# Patient Record
Sex: Male | Born: 1960 | Race: Black or African American | Hispanic: No | Marital: Single | Smoking: Current every day smoker
Health system: Southern US, Community
[De-identification: ages and names within clinical notes are randomized; demographics above are authoritative.]

## PROBLEM LIST (undated history)

## (undated) HISTORY — PX: CARDIAC SURGERY: SHX584

---

## 2018-09-15 ENCOUNTER — Emergency Department (HOSPITAL_COMMUNITY)
Admission: EM | Admit: 2018-09-15 | Discharge: 2018-09-15 | Disposition: A | Discharge status: Home / Self Care | Payer: Medicaid Other | Attending: Emergency Medicine | Admitting: Emergency Medicine

## 2018-09-15 ENCOUNTER — Other Ambulatory Visit: Payer: Self-pay

## 2018-09-15 ENCOUNTER — Encounter (HOSPITAL_COMMUNITY): Payer: Self-pay | Admitting: Emergency Medicine

## 2018-09-15 DIAGNOSIS — F17210 Nicotine dependence, cigarettes, uncomplicated: Secondary | ICD-10-CM | POA: Insufficient documentation

## 2018-09-15 DIAGNOSIS — H109 Unspecified conjunctivitis: Secondary | ICD-10-CM

## 2018-09-15 DIAGNOSIS — H1031 Unspecified acute conjunctivitis, right eye: Secondary | ICD-10-CM | POA: Insufficient documentation

## 2018-09-15 DIAGNOSIS — H11001 Unspecified pterygium of right eye: Secondary | ICD-10-CM | POA: Diagnosis not present

## 2018-09-15 DIAGNOSIS — H579 Unspecified disorder of eye and adnexa: Secondary | ICD-10-CM | POA: Diagnosis present

## 2018-09-15 MED ORDER — FLUORESCEIN SODIUM 1 MG OP STRP
1.0000 | ORAL_STRIP | Freq: Once | OPHTHALMIC | Status: AC
  Administered 2018-09-15: 1 via OPHTHALMIC
  Filled 2018-09-15: qty 1

## 2018-09-15 MED ORDER — ERYTHROMYCIN 5 MG/GM OP OINT
TOPICAL_OINTMENT | Freq: Once | OPHTHALMIC | Status: AC
  Administered 2018-09-15: 1 via OPHTHALMIC
  Filled 2018-09-15: qty 3.5

## 2018-09-15 MED ORDER — TETRACAINE HCL 0.5 % OP SOLN
2.0000 [drp] | Freq: Once | OPHTHALMIC | Status: AC
  Administered 2018-09-15: 2 [drp] via OPHTHALMIC
  Filled 2018-09-15: qty 4

## 2018-09-15 NOTE — ED Provider Notes (Signed)
[facility]-EMERGENCY DEPT Provider Note   CSN: [id number] Arrival date & time: 09/15/18  1916     History   Chief Complaint Chief Complaint  Patient presents with  . Eye Problem    HPI Jeremy Bryant is a 58 y.o. male with a hx of tobacco abuse who presents to the ED with complaints of R eye irritation x 2-3 days. Patient reports R eye is red, itchy, irritated, and has crusty drainage especially in the AM. Applied topical abx ointment that was OTC with some relief. Some mild blurry vision at times in this eye when it is irritated/crusted. Denies fever, chills, congestion, eye pain, vomiting, or headache. No contact lens/glasses use.   HPI  History reviewed. No pertinent past medical history.  There are no active problems to display for this patient.   Past Surgical History:  Procedure Laterality Date  . CARDIAC SURGERY          Home Medications    Prior to Admission medications   Not on File    Family History History reviewed. No pertinent family history.  Social History Social History   Tobacco Use  . Smoking status: Current Every Day Smoker  . Smokeless tobacco: Never Used  Substance Use Topics  . Alcohol use: Not on file  . Drug use: Not on file     Allergies   Patient has no allergy information on record.   Review of Systems Review of Systems  Constitutional: Negative for chills and fever.  HENT: Negative for congestion and sore throat.   Eyes: Positive for discharge, redness, itching and visual disturbance.  Respiratory: Negative for cough and shortness of breath.   Cardiovascular: Negative for chest pain.     Physical Exam Updated Vital Signs BP (!) 144/83   Pulse 71   Temp 98.9 F (37.2 C) (Oral)   Resp 18   Ht 5' 11.5" (1.816 m)   Wt 97.5 kg   SpO2 96%   BMI 29.57 kg/m   Physical Exam Vitals signs and nursing note reviewed.  Constitutional:      General: He is not in acute distress.    Appearance: He is  well-developed.  HENT:     Head: Normocephalic and atraumatic.     Right Ear: Tympanic membrane normal.     Left Ear: Tympanic membrane normal.     Nose: Congestion present.     Mouth/Throat:     Mouth: Mucous membranes are moist.     Pharynx: No oropharyngeal exudate or posterior oropharyngeal erythema.  Eyes:     General: Lids are everted, no foreign bodies appreciated. Vision grossly intact. Gaze aligned appropriately.        Right eye: No foreign body or discharge.        Left eye: No foreign body or discharge.     Extraocular Movements: Extraocular movements intact.     Conjunctiva/sclera:     Right eye: Right conjunctiva is injected. No chemosis, exudate or hemorrhage.    Left eye: Left conjunctiva is not injected. No chemosis, exudate or hemorrhage.    Comments: No proptosis. No periorbital erythema/edema/tenderness. EOMI. Visual acuity 20/50 in each eye and bilaterally.  R eye with pterygium present to medial aspect of the eye.  R eye: Fluorescein stain without uptake. No evidence of abrasion/ulceration. No dendritic staining. R eye IOP 18.   Neck:     Musculoskeletal: Normal range of motion. No neck rigidity.  Neurological:     Mental Status: He  is alert.     Comments: Clear speech.   Psychiatric:        Behavior: Behavior normal.        Thought Content: Thought content normal.      ED Treatments / Results  Labs (all labs ordered are listed, but only abnormal results are displayed) Labs Reviewed - No data to display  EKG None  Radiology No results found.  Procedures Procedures (including critical care time)  Medications Ordered in ED Medications  fluorescein ophthalmic strip 1 strip (1 strip Right Eye Given 09/15/18 1951)  tetracaine (PONTOCAINE) 0.5 % ophthalmic solution 2 drop (2 drops Right Eye Given 09/15/18 1950)     Initial Impression / Assessment and Plan / ED Course  I have reviewed the triage vital signs and the nursing notes.  Pertinent labs &  imaging results that were available during my care of the patient were reviewed by me and considered in my medical decision making (see chart for details).   Patient presents to the ED w/ right eye irritation, erythema, and crusty drainage. PERRL. Patient is afebrile and without proptosis, entrapment, or consensual photophobia, no periorbital swelling- doubt periorbital or orbital cellulitis. There is no fluorescein uptake on exam, no indication of corneal abrasion/ulceration or HSV.  IOP WNL, doubt acute glaucoma. No significant visual acuity deficit- equal bilaterally. He does have a pterygium present. Possible conjunctivitis based on history & exam. Given crusty drainage in the AM will cover for bacterial w/ erythromycin ophthalmic ointment, patient is not a contact lens wearer. Ophthalmology follow up. I discussed results, treatment plan, need for follow-up, and return precautions with the patient. Provided opportunity for questions, patient confirmed understanding and is in agreement with plan.    Final Clinical Impressions(s) / ED Diagnoses   Final diagnoses:  Conjunctivitis of right eye, unspecified conjunctivitis type  Pterygium of right eye    ED Discharge Orders    None       Desmond Lope 09/15/18 2039    Raeford Razor, MD 09/16/18 1054

## 2018-09-15 NOTE — ED Triage Notes (Signed)
Three days ago the patient noticed itching puff red eye. He has applied ointment and lube with some relief but he still has redness in the right eye.

## 2018-09-15 NOTE — ED Notes (Signed)
Both eyes 20/50

## 2018-09-15 NOTE — Discharge Instructions (Signed)
You were seen in the ER for eye irritation. Please use the erythromycin ointment: 1/2 inch ribbon 4-6 times per day for the next 5-7 days. Follow up with ophthalmology or primary care within 3 days for re-evaluation. Return to the ER for new or worsening symptoms including but not limited to worsened pain, pain moving the eye, change in your vision, headache, swelling/redness around the eye, or any other concerns.

## 2018-10-06 ENCOUNTER — Emergency Department (HOSPITAL_COMMUNITY)
Admission: EM | Admit: 2018-10-06 | Discharge: 2018-10-06 | Disposition: A | Discharge status: Home / Self Care | Payer: Medicaid Other | Attending: Emergency Medicine | Admitting: Emergency Medicine

## 2018-10-06 ENCOUNTER — Other Ambulatory Visit: Payer: Self-pay

## 2018-10-06 ENCOUNTER — Emergency Department (HOSPITAL_COMMUNITY): Payer: Medicaid Other

## 2018-10-06 ENCOUNTER — Encounter (HOSPITAL_COMMUNITY): Payer: Self-pay

## 2018-10-06 DIAGNOSIS — F17210 Nicotine dependence, cigarettes, uncomplicated: Secondary | ICD-10-CM | POA: Diagnosis not present

## 2018-10-06 DIAGNOSIS — F1910 Other psychoactive substance abuse, uncomplicated: Secondary | ICD-10-CM | POA: Insufficient documentation

## 2018-10-06 DIAGNOSIS — R072 Precordial pain: Secondary | ICD-10-CM | POA: Diagnosis not present

## 2018-10-06 DIAGNOSIS — R079 Chest pain, unspecified: Secondary | ICD-10-CM | POA: Diagnosis present

## 2018-10-06 LAB — CBC WITH DIFFERENTIAL/PLATELET
Abs Immature Granulocytes: 0.03 10*3/uL (ref 0.00–0.07)
Basophils Absolute: 0.1 10*3/uL (ref 0.0–0.1)
Basophils Relative: 1 %
Eosinophils Absolute: 0.2 10*3/uL (ref 0.0–0.5)
Eosinophils Relative: 2 %
HEMATOCRIT: 45.5 % (ref 39.0–52.0)
HEMOGLOBIN: 14.9 g/dL (ref 13.0–17.0)
Immature Granulocytes: 0 %
LYMPHS PCT: 17 %
Lymphs Abs: 1.6 10*3/uL (ref 0.7–4.0)
MCH: 30.7 pg (ref 26.0–34.0)
MCHC: 32.7 g/dL (ref 30.0–36.0)
MCV: 93.8 fL (ref 80.0–100.0)
Monocytes Absolute: 0.8 10*3/uL (ref 0.1–1.0)
Monocytes Relative: 9 %
Neutro Abs: 6.6 10*3/uL (ref 1.7–7.7)
Neutrophils Relative %: 71 %
Platelets: 208 10*3/uL (ref 150–400)
RBC: 4.85 MIL/uL (ref 4.22–5.81)
RDW: 12.2 % (ref 11.5–15.5)
WBC: 9.4 10*3/uL (ref 4.0–10.5)
nRBC: 0 % (ref 0.0–0.2)

## 2018-10-06 LAB — BASIC METABOLIC PANEL
Anion gap: 15 (ref 5–15)
BUN: 15 mg/dL (ref 6–20)
CO2: 19 mmol/L — ABNORMAL LOW (ref 22–32)
Calcium: 8.7 mg/dL — ABNORMAL LOW (ref 8.9–10.3)
Chloride: 103 mmol/L (ref 98–111)
Creatinine, Ser: 1.22 mg/dL (ref 0.61–1.24)
GFR calc Af Amer: >60 mL/min (ref >60)
GFR calc non Af Amer: >60 mL/min (ref >60)
Glucose, Bld: 90 mg/dL (ref 70–99)
Potassium: 3.7 mmol/L (ref 3.5–5.1)
Sodium: 137 mmol/L (ref 135–145)

## 2018-10-06 LAB — ETHANOL: Alcohol, Ethyl (B): 44 mg/dL — ABNORMAL HIGH (ref <10)

## 2018-10-06 LAB — TROPONIN I
Troponin I: <0.03 ng/mL (ref <0.03)
Troponin I: <0.03 ng/mL (ref <0.03)

## 2018-10-06 NOTE — ED Notes (Signed)
Gave pt urinal and instructed him to give us a sample

## 2018-10-06 NOTE — ED Notes (Signed)
Patient transported to X-ray 

## 2018-10-06 NOTE — ED Triage Notes (Signed)
Pt arrives EMS from scene where he developed chest pain after snorting unknown opiate, possibly fentanyl, possible crack use. Walked up to PD 324 aspirin and nitro 0.4 PTA

## 2018-10-06 NOTE — ED Provider Notes (Signed)
Emergency Department Provider Note   I have reviewed the triage vital signs and the nursing notes.   HISTORY  Chief Complaint Chest Pain   HPI Jeremy Bryant is a 58 y.o. male with PMH of CAD s/p triple bypass and polysubstance abuse presents to the emergency department for evaluation after experiencing chest pain while using drugs.  The patient snorted an unknown substance which he presumed was some kind of opiate but is concerned it may have been laced with cocaine.  He did use cocaine yesterday.  He was using these drugs with another person who injected them but he denies any IV drug use.  No fevers.  He had sudden onset of chest pain shortly after snorting drugs and apparently walked up to a Emergency planning/management officer for help who called EMS.  He was given 324 mg aspirin and a single nitroglycerin.  His chest pain has since resolved.  EMS report the patient has become somewhat more somnolent in route.  Patient denies any other associated symptoms or pain.    History reviewed. No pertinent past medical history.  There are no active problems to display for this patient.   Past Surgical History:  Procedure Laterality Date  . CARDIAC SURGERY      Allergies Iodinated diagnostic agents and Nsaids  History reviewed. No pertinent family history.  Social History Social History   Tobacco Use  . Smoking status: Current Every Day Smoker  . Smokeless tobacco: Never Used  Substance Use Topics  . Alcohol use: Yes  . Drug use: Yes    Types: Cocaine    Comment: fentanyl    Review of Systems  Constitutional: No fever/chills Eyes: No visual changes. ENT: No sore throat. Cardiovascular: Positive chest pain. Respiratory: Denies shortness of breath. Gastrointestinal: No abdominal pain.  No nausea, no vomiting.  No diarrhea.  No constipation. Genitourinary: Negative for dysuria. Musculoskeletal: Negative for back pain. Skin: Negative for rash. Neurological: Negative for headaches, focal  weakness or numbness.  10-point ROS otherwise negative.  ____________________________________________   PHYSICAL EXAM:  VITAL SIGNS: ED Triage Vitals  Enc Vitals Group     BP 10/06/18 1755 137/66     Pulse Rate 10/06/18 1755 77     Resp 10/06/18 1755 20     Temp 10/06/18 1755 98.7 F (37.1 C)     Temp Source 10/06/18 1755 Oral     SpO2 10/06/18 1755 97 %     Weight 10/06/18 1757 215 lb (97.5 kg)     Height 10/06/18 1757 6' (1.829 m)     Pain Score 10/06/18 1756 0   Constitutional: Alert but slightly drowsiness. No acute distress.  Eyes: Conjunctivae are normal.  Head: Atraumatic. Nose: No congestion/rhinnorhea. Mouth/Throat: Mucous membranes are moist.  Neck: No stridor.  Cardiovascular: Normal rate, regular rhythm. Good peripheral circulation. Grossly normal heart sounds.   Respiratory: Normal respiratory effort.  No retractions. Lungs CTAB. Gastrointestinal: Soft and nontender. No distention.  Musculoskeletal: No lower extremity tenderness nor edema. No gross deformities of extremities. Neurologic:  Normal speech and language. No gross focal neurologic deficits are appreciated.  Skin:  Skin is warm, dry and intact. No rash noted.  ____________________________________________   LABS (all labs ordered are listed, but only abnormal results are displayed)  Labs Reviewed  BASIC METABOLIC PANEL - Abnormal; Notable for the following components:      Result Value   CO2 19 (*)    Calcium 8.7 (*)    All other components within normal limits  ETHANOL - Abnormal; Notable for the following components:   Alcohol, Ethyl (B) 44 (*)    All other components within normal limits  RAPID URINE DRUG SCREEN, HOSP PERFORMED - Abnormal; Notable for the following components:   Opiates POSITIVE (*)    Cocaine POSITIVE (*)    All other components within normal limits  TROPONIN I  CBC WITH DIFFERENTIAL/PLATELET  TROPONIN I   ____________________________________________  EKG   EKG  Interpretation  Date/Time:  Monday October 06 2018 17:54:39 EST Ventricular Rate:  75 PR Interval:    QRS Duration: 162 QT Interval:  439 QTC Calculation: 491 R Axis:   -44 Text Interpretation:  Sinus rhythm Atrial premature complexes Probable left atrial enlargement Right bundle branch block Anterolateral infarct, age indeterminate No STEMI. No prior tracing for comparison.  Confirmed by Alona Bene [phone]) on 10/06/2018 5:57:33 PM       ____________________________________________  RADIOLOGY  Dg Chest 2 View  Result Date: 10/06/2018 CLINICAL DATA:  Chest pain following drug use. EXAM: CHEST - 2 VIEW COMPARISON:  None. FINDINGS: Enlarged cardiac silhouette. Post CABG changes. Diffuse peribronchial thickening. Thoracolumbar spine degenerative changes. IMPRESSION: Cardiomegaly and chronic bronchitic changes. Electronically Signed   By: Beckie Salts M.D.   On: 10/06/2018 19:25    ____________________________________________   PROCEDURES  Procedure(s) performed:   Procedures  None ____________________________________________   INITIAL IMPRESSION / ASSESSMENT AND PLAN / ED COURSE  Pertinent labs & imaging results that were available during my care of the patient were reviewed by me and considered in my medical decision making (see chart for details).  Patient presents to the emergency department with chest pain after snorting an unknown substance.  He suspect that this was a mixture of some kind of opiate and possible cocaine.  Chest pain resolved in route with EMS.  He is slightly drowsy but breathing at a normal rate and with normal oxygen saturation.  No Narcan at this time.  Plan for screening labs.  EKG is abnormal and I do not have a prior image for comparison.  I did look in care everywhere and found a dictation of an EKG from January 3 which seems similar to what I am seeing here.  No STEMI on our tracing today.   Labs and repeat troponin negative. Patient remains  CP-free and ambulatory in the ED without assistance. Discussed dangers of drug use given cardiac history. Provided a list of community resources for assistance with drug abuse. Discussed ED return precautions in detail. Patient verbalizes understanding of instructions.  ____________________________________________  FINAL CLINICAL IMPRESSION(S) / ED DIAGNOSES  Final diagnoses:  Precordial chest pain  Polysubstance abuse (HCC)    Note:  This document was prepared using Dragon voice recognition software and may include unintentional dictation errors.  Alona Bene, MD Emergency Medicine    Kooper Godshall, Arlyss Repress, MD 10/07/18 1003

## 2018-10-06 NOTE — ED Notes (Signed)
Pt refused to take his discharge paper work with him, pt angry and states that he wants to stay until the morning when he has a ride.

## 2018-10-06 NOTE — Discharge Instructions (Signed)
Substance Abuse Treatment Programs ° °Intensive Outpatient Programs °[facility]     °[address]      °[place], [place]                   °[phone]      ° °[facility] °[address] °[place], [place] °[phone] ° °[facility]     °(Inpatient and outpatient)     °[address]           °[phone]   ° °[facility] °[phone] (Suboxone and Methadone) ° °[address]      °[place], [place] [address]      °[phone]      ° °[address] °[place], [place] °852-3033 ° °[facility] (Outpatient/Inpatient, Chemical)    °(insurance only) [phone]      °       °[facility] (Groups & Residential) °[place], [place] °[phone] ° °   °[facility]     °[address]     °[place], [place]      °[phone]      ° °[facility] (for caregivers and family) °[address] °[place], [place] °336-299-4655 ° ° ° ° ° °Residential Treatment Programs °[facility]      °[address], [place], [place] [address]  °[phone]      ° °[facility] °[address] [place], [place] [address] °[phone] ° °[facility]        °[phone]      ° °[facility] °[phone] ° °[facility]             °[address]                                         °[place], [place]                                                °[phone] or 336-784-9470                              ° °[facility] [place] °[address] °[place], [address] °[phone] ° °[facility]    °[address]      °[place], [place]     °[phone]      ° °[facility] °[address] °, [place] °[phone] ° °[facility]   °[address]     °[place], [place] [address]     °[phone]      °Admissions: 8am-3pm M-F ° °[facility] °[address] °[place] °336-227-7417 ° °[facility]: Residential Program (90 Days)   °[place], [place]      °336-725-8389 or [phone]    ° °[facility] °[place],  °(Walk in Hours over the weekend or by referral) ° °[facility] °[address], [place],  [address] °([phone] ° °Crisis Mobile: [facility]:  1-877-626-1772 (for crisis response 24 hours a day) °[facility] Hotline:      1-800-256-2452 °Outpatient Psychiatry and Counseling ° °[facility]: Mobile Crisis   Management 24 hours:  1-877-626-1772 ° °[facility] sliding scale fee and walk in schedule: M-F 8am-12pm/1pm-3pm °[address]  °[place], [place] [address] °[phone] ° °[facility] °[address] °[place], [place] [address] °[phone] ° °[facility] ([facility])- new patient walk-in appointments available Monday - Friday 8am -3pm.          °[address] °[place], [place] [address] °336-676-6840 or crisis line- 336-676-6905 ° °[facility] Intensive Outpatient Therapy Program °[address] °[place], [place] [address] °336-832-9804 ° °[facility]                  °Crisis Services      °[phone]      °[address]     °[place], [place] [address]                ° °[facility]   °[facility] °[phone] °[address] °[place], [place] [address] ° ° °[facility]          °[address]  °[place], [place] [address]       °[phone] ° °[facility] °[address] °[place], [place] [address] °336-292-1510 ° °[facility]    °[address]    °[place], [place] [address]     °[phone]      ° °Parish McKinney, MD     °[address]     °[place] [address]     °[phone]     °  °[facility] °[address] °[place] [address] ° °[facility]     °[address]     °[place], [place]      °336-542-2076      ° °[facility] °Shamsher Ahluwalia, MD °[address] °[place], [place] [address] °[phone] ° °[facility]     °[address]     °[place], [place] [address]     °336-274-1237      ° °[facility] °[address] °[place], [place] [address] °336-854-4450 °Resources for Temporary Residential Assistance/Crisis Centers ° °DAY CENTERS °[facility] °M-F 8am-3pm   °[address] [place], [place] [address]   336-332-0824 °Services include: laundry, barbering, support groups, case management, phone  & computer access, showers, AA/NA mtgs, mental health/substance abuse nurse, job skills class, disability information, VA assistance, spiritual classes, etc.  ° °HOMELESS SHELTERS ° °[facility]     °[facility]   °[address], [place]     °[phone]       °       °[facility] (women and children)       °[address] °[place], [place] [address] °336-275-0820 °[email or web address] for application and process °Application Required ° °[facility]   °[address]    °[place] [address]     °[phone]       °             °[facility] °[address] °[place], [place] [address] °336.273.5572 °336-235-0363(schedule application appt.) °Application Required ° °[facility] (women only)    °[address]     °[place],  [address]     °336-884-1039      °  Intake starts 6pm daily °Need valid ID, SSC, & Police report °[facility] [place] °[address] °[place], [place] °336-881-5420 °Application Required ° °[facility] (men only)     °[address]      °[place], [place]     °[phone]      ° °[facility] °(Pregnant women only) °[address] °[place], [place] °336-275-0206 ° °[facility]      °[address]      °[place], [place] [address]     °[phone]      °       °[facility] °[address] °[place], [place] °336-723-1848 °90 day commitment/SA/Application process ° °[facility](men only)     °[address]     °[place], [place]     °[phone]       °Check-in at 7pm     °       °[facility] °[address] °[place], [place] [address] °336-248-6684 °Men/Women/Women and Children must be there by 7 pm ° °[facility] °[place], [place] °336-722-8721                ° °

## 2018-10-07 LAB — RAPID URINE DRUG SCREEN, HOSP PERFORMED
Amphetamines: NOT DETECTED
Barbiturates: NOT DETECTED
Benzodiazepines: NOT DETECTED
Cocaine: POSITIVE — AB
Opiates: POSITIVE — AB
Tetrahydrocannabinol: NOT DETECTED

## 2019-10-01 IMAGING — CR DG CHEST 2V
2 series · 2 of 2 positions shown · non-contrast
Comparison: None.

CLINICAL DATA: Chest pain following drug use.

EXAM:
CHEST - 2 VIEW

[chest lat]
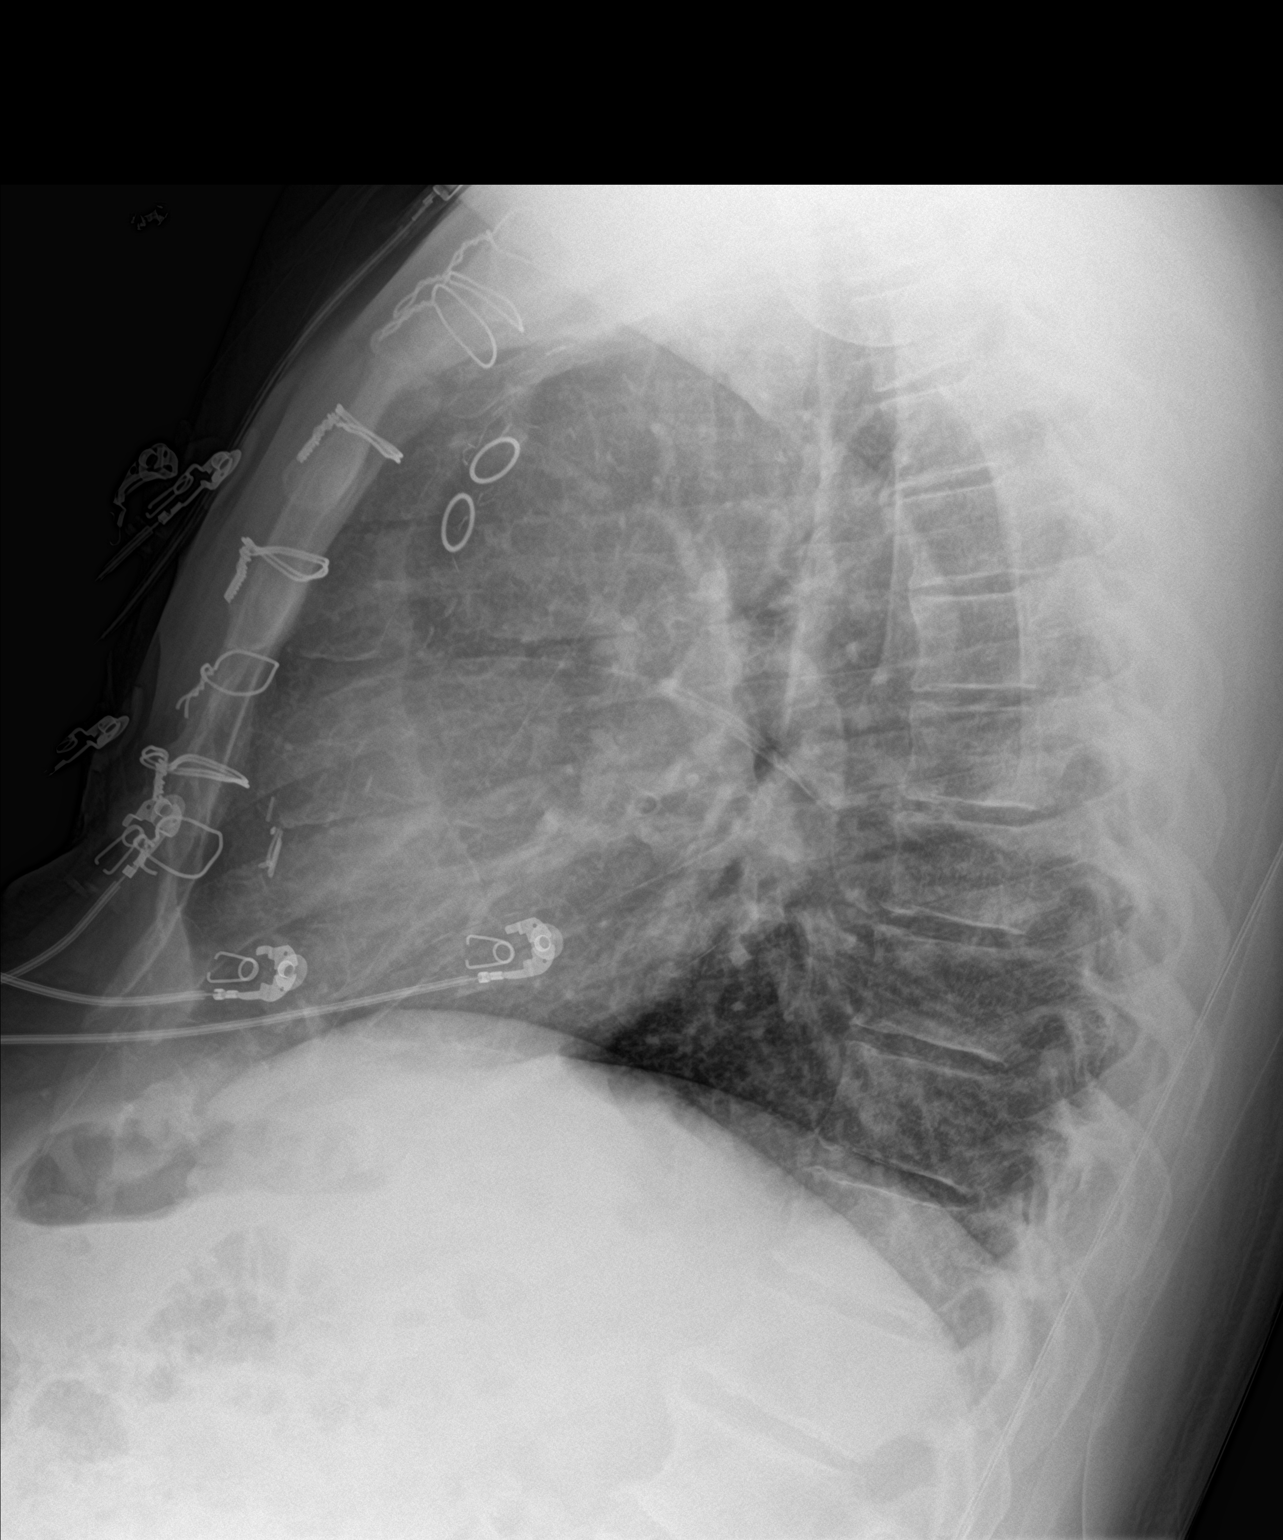

[chest ap]
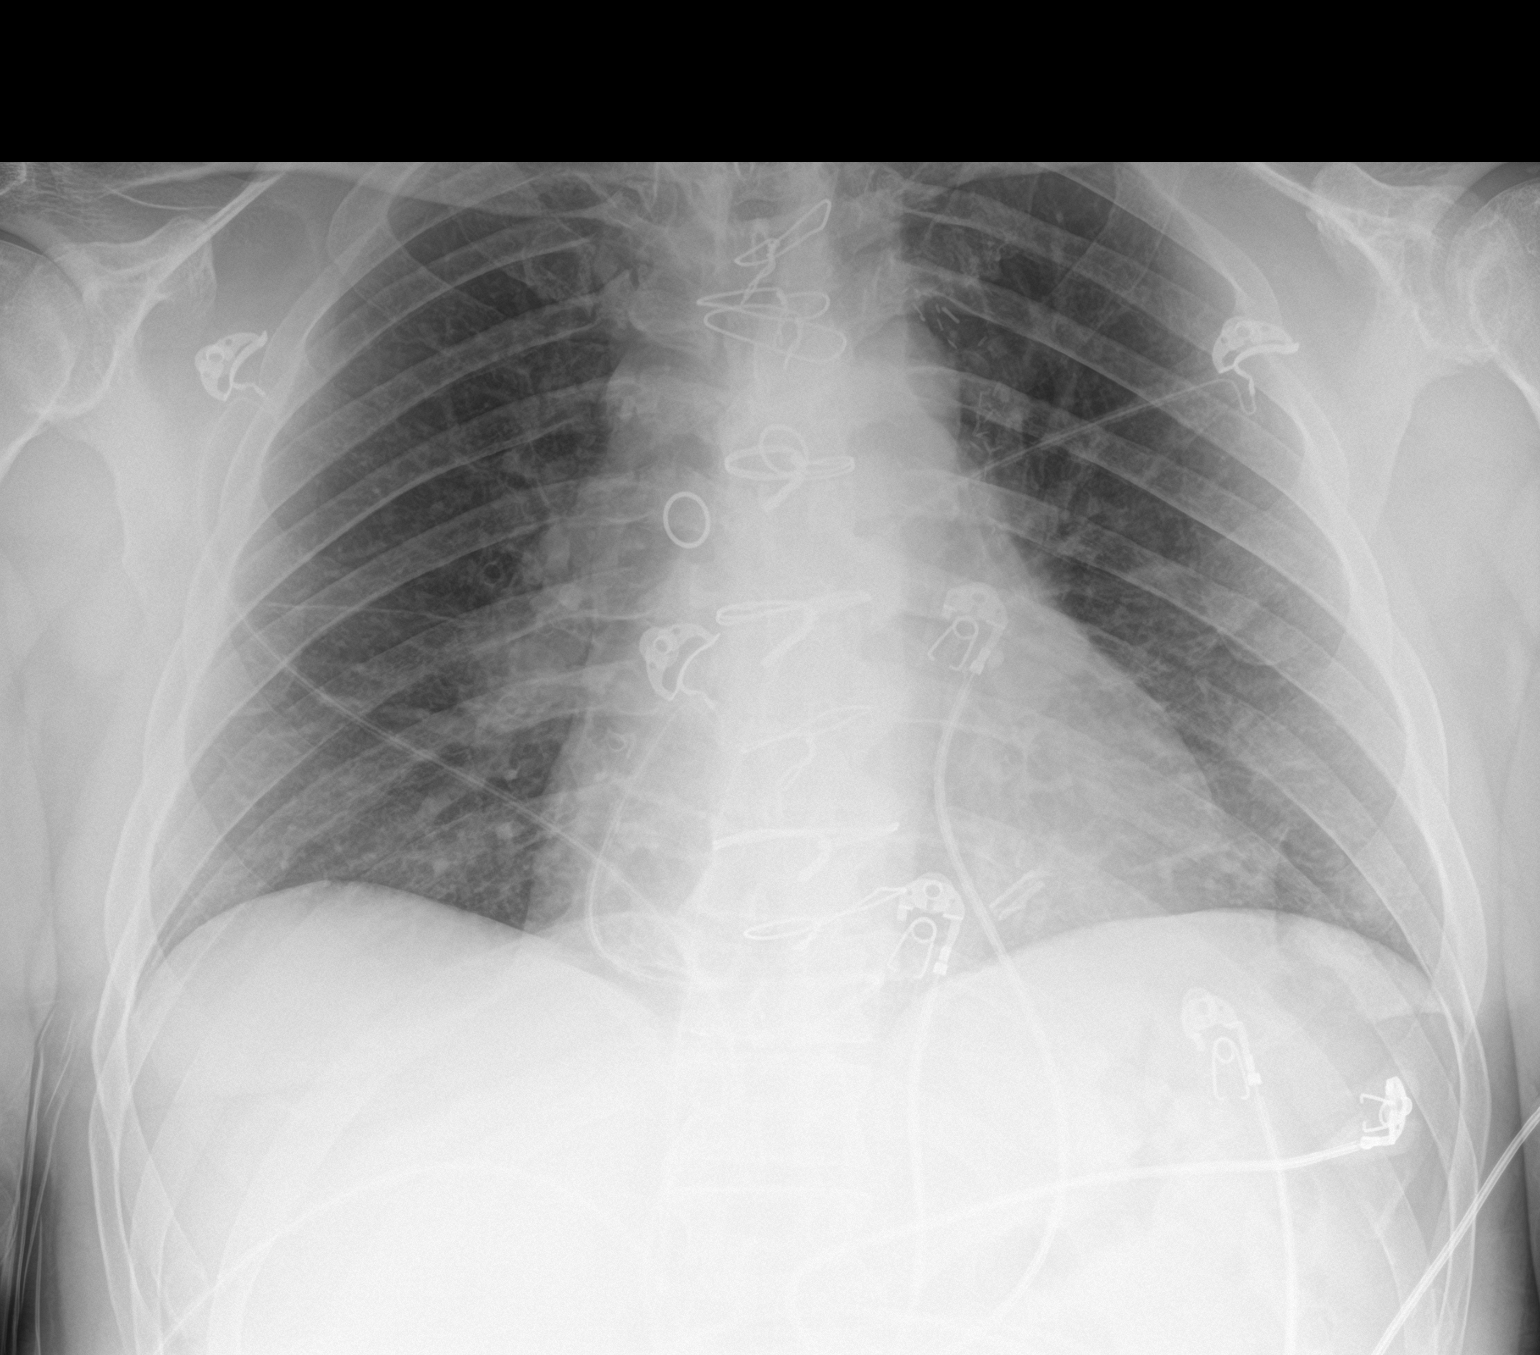

[2 of 2 positions shown; findings below may reference images not displayed]

FINDINGS: Enlarged cardiac silhouette. Post CABG changes. Diffuse
peribronchial thickening. Thoracolumbar spine degenerative changes.
IMPRESSION: Cardiomegaly and chronic bronchitic changes.
